# Patient Record
Sex: Female | Born: 2000 | Race: White | Hispanic: No | Marital: Single
Health system: Southern US, Community
[De-identification: ages and names within clinical notes are randomized; demographics above are authoritative.]

## PROBLEM LIST (undated history)

## (undated) HISTORY — PX: TONSILLECTOMY: SUR1361

## (undated) HISTORY — PX: SPINAL FUSION: SHX223

---

## 2022-02-04 ENCOUNTER — Emergency Department (HOSPITAL_COMMUNITY): Payer: Managed Care, Other (non HMO)

## 2022-02-04 ENCOUNTER — Emergency Department (HOSPITAL_COMMUNITY)
Admission: EM | Admit: 2022-02-04 | Discharge: 2022-02-04 | Disposition: A | Discharge status: Home / Self Care | Payer: Managed Care, Other (non HMO) | Attending: Emergency Medicine | Admitting: Emergency Medicine

## 2022-02-04 ENCOUNTER — Telehealth (HOSPITAL_COMMUNITY): Payer: Self-pay | Admitting: Emergency Medicine

## 2022-02-04 ENCOUNTER — Other Ambulatory Visit: Payer: Self-pay

## 2022-02-04 ENCOUNTER — Other Ambulatory Visit (HOSPITAL_COMMUNITY): Payer: Self-pay

## 2022-02-04 DIAGNOSIS — R1030 Lower abdominal pain, unspecified: Secondary | ICD-10-CM | POA: Diagnosis present

## 2022-02-04 DIAGNOSIS — N200 Calculus of kidney: Secondary | ICD-10-CM

## 2022-02-04 DIAGNOSIS — N390 Urinary tract infection, site not specified: Secondary | ICD-10-CM | POA: Insufficient documentation

## 2022-02-04 LAB — BASIC METABOLIC PANEL
Anion gap: 11 (ref 5–15)
BUN: 10 mg/dL (ref 6–20)
CO2: 21 mmol/L — ABNORMAL LOW (ref 22–32)
Calcium: 9.6 mg/dL (ref 8.9–10.3)
Chloride: 104 mmol/L (ref 98–111)
Creatinine, Ser: 0.98 mg/dL (ref 0.44–1.00)
GFR, Estimated: >60 mL/min (ref >60)
Glucose, Bld: 100 mg/dL — ABNORMAL HIGH (ref 70–99)
Potassium: 3.6 mmol/L (ref 3.5–5.1)
Sodium: 136 mmol/L (ref 135–145)

## 2022-02-04 LAB — CBC WITH DIFFERENTIAL/PLATELET
Abs Immature Granulocytes: 0.02 10*3/uL (ref 0.00–0.07)
Basophils Absolute: 0 10*3/uL (ref 0.0–0.1)
Basophils Relative: 0 %
Eosinophils Absolute: 0.2 10*3/uL (ref 0.0–0.5)
Eosinophils Relative: 3 %
HCT: 38.7 % (ref 36.0–46.0)
Hemoglobin: 13.2 g/dL (ref 12.0–15.0)
Immature Granulocytes: 0 %
Lymphocytes Relative: 46 %
Lymphs Abs: 4 10*3/uL (ref 0.7–4.0)
MCH: 28.9 pg (ref 26.0–34.0)
MCHC: 34.1 g/dL (ref 30.0–36.0)
MCV: 84.7 fL (ref 80.0–100.0)
Monocytes Absolute: 0.7 10*3/uL (ref 0.1–1.0)
Monocytes Relative: 9 %
Neutro Abs: 3.6 10*3/uL (ref 1.7–7.7)
Neutrophils Relative %: 42 %
Platelets: 332 10*3/uL (ref 150–400)
RBC: 4.57 MIL/uL (ref 3.87–5.11)
RDW: 12.2 % (ref 11.5–15.5)
WBC: 8.5 10*3/uL (ref 4.0–10.5)
nRBC: 0 % (ref 0.0–0.2)

## 2022-02-04 LAB — URINALYSIS, MICROSCOPIC (REFLEX)

## 2022-02-04 LAB — URINALYSIS, ROUTINE W REFLEX MICROSCOPIC
Bilirubin Urine: NEGATIVE
Glucose, UA: NEGATIVE mg/dL
Hgb urine dipstick: NEGATIVE
Ketones, ur: NEGATIVE mg/dL
Leukocytes,Ua: NEGATIVE
Nitrite: NEGATIVE
Protein, ur: 30 mg/dL — AB
Specific Gravity, Urine: 1.023 (ref 1.005–1.030)
pH: 5 (ref 5.0–8.0)

## 2022-02-04 LAB — I-STAT BETA HCG BLOOD, ED (MC, WL, AP ONLY): I-stat hCG, quantitative: <5 m[IU]/mL (ref <5)

## 2022-02-04 MED ORDER — OXYCODONE-ACETAMINOPHEN 5-325 MG PO TABS: 1.0000 | ORAL_TABLET | Freq: Four times a day (QID) | ORAL | 0 refills | Status: DC | PRN

## 2022-02-04 MED ORDER — OXYCODONE-ACETAMINOPHEN 5-325 MG PO TABS
1.0000 | ORAL_TABLET | Freq: Four times a day (QID) | ORAL | 0 refills | Status: DC | PRN
  Filled 2022-02-04: qty 20, 5d supply, fill #0

## 2022-02-04 MED ORDER — ONDANSETRON HCL 4 MG/2ML IJ SOLN
4.0000 mg | Freq: Once | INTRAMUSCULAR | Status: AC
  Administered 2022-02-04: 4 mg via INTRAVENOUS
  Filled 2022-02-04: qty 2

## 2022-02-04 MED ORDER — HYDROMORPHONE HCL 1 MG/ML IJ SOLN
1.0000 mg | Freq: Once | INTRAMUSCULAR | Status: AC
  Administered 2022-02-04: 1 mg via INTRAVENOUS
  Filled 2022-02-04: qty 1

## 2022-02-04 MED ORDER — KETOROLAC TROMETHAMINE 30 MG/ML IJ SOLN
30.0000 mg | Freq: Once | INTRAMUSCULAR | Status: AC
  Administered 2022-02-04: 30 mg via INTRAVENOUS
  Filled 2022-02-04: qty 1

## 2022-02-04 NOTE — Discharge Instructions (Addendum)
Begin taking Percocet as prescribed as needed for pain. ? ?Continue taking Macrobid as previously prescribed. ? ?Stop taking Pyridium. ? ?Follow-up with urology if the stone has not passed in the next 3 to 4 days.  The contact information for [facility] has been provided in this discharge summary for you to call and make these arrangements. ?

## 2022-02-04 NOTE — ED Triage Notes (Signed)
Pt reported to ED with c/o lower abdominal pain and pain to urethra. Pt states she was seen at [facility] today and diagnosed with UTI and told that if medication does not work within two hours to go to ED to be evaluated. ?

## 2022-02-04 NOTE — ED Provider Triage Note (Signed)
Emergency Medicine Provider Triage Evaluation Note ? ?Kimberly Bond , a 21 y.o. female  was evaluated in triage.  Pt complains of urinary frequency/dysuria.  Seen at UC earlier today and diagnosed with UTI.  She was given pyridium and macrobid but denies improvement.  States pain is severe.  Has had blood in urine.  Hx of stones.  No period in months, she is on Depo but did not have pregnancy test done earlier. ? ?Review of Systems  ?Positive: dysuria ?Negative: fever ? ?Physical Exam  ?BP (!) 138/99 (BP Location: Left Arm)   Pulse (!) 102   Temp 97.9 ?F (36.6 ?C)   Resp 18   SpO2 99%  ? ?Gen:   Awake, yelling/crying in triage ?Resp:  Normal effort  ?MSK:   Moves extremities without difficulty  ?Other:   ? ?Medical Decision Making  ?Medically screening exam initiated at 1:43 AM.  Appropriate orders placed.  Kimberly Bond was informed that the remainder of the evaluation will be completed by another provider, this initial triage assessment does not replace that evaluation, and the importance of remaining in the ED until their evaluation is complete. ? ?Dysuria.  Seen earlier for same, states meds are not helping.  She has not had a period in several months, on Depo.  No pregnancy test done earlier.  Will repeat labs, obtain cell study. ?  ?Kimberly Hatchet, PA-C ?02/04/22 0147 ? ?

## 2022-02-04 NOTE — Telephone Encounter (Signed)
Pharmacy was reportedly out of medication.  We will send to other pharmacy. ?

## 2022-02-04 NOTE — ED Provider Notes (Signed)
?[facility] EMERGENCY DEPARTMENT ?Provider Note ? ? ?CSN: [id number] ?Arrival date & time: 02/04/22  0128 ? ?  ? ?History ? ?Chief Complaint  ?Patient presents with  ? urinary pain  ? ? ?Kimberly Bond is a 21 y.o. female. ? ?Patient is a 21 year old female with history of kidney stones presenting with complaints of dysuria, suprapubic pain.  This began this morning and worsened throughout the day.  She went to urgent care and was diagnosed with a UTI.  She was treated with antibiotics and Pyridium.  She presents here stating that her symptoms are no better and are worsening.  She denies any fevers or chills.  She denies any bowel complaints. ? ?The history is provided by the patient.  ? ?  ? ?Home Medications ?Prior to Admission medications   ?Not on File  ?   ? ?Allergies    ?Azithromycin, Meloxicam, and Morphine   ? ?Review of Systems   ?Review of Systems  ?All other systems reviewed and are negative. ? ?Physical Exam ?Updated Vital Signs ?BP (!) 138/99 (BP Location: Left Arm)   Pulse (!) 102   Temp 97.9 ?F (36.6 ?C)   Resp 18   SpO2 99%  ?Physical Exam ?Vitals and nursing note reviewed.  ?Constitutional:   ?   General: She is not in acute distress. ?   Appearance: She is well-developed. She is not diaphoretic.  ?HENT:  ?   Head: Normocephalic and atraumatic.  ?Cardiovascular:  ?   Rate and Rhythm: Normal rate and regular rhythm.  ?   Heart sounds: No murmur heard. ?  No friction rub. No gallop.  ?Pulmonary:  ?   Effort: Pulmonary effort is normal. No respiratory distress.  ?   Breath sounds: Normal breath sounds. No wheezing.  ?Abdominal:  ?   General: Bowel sounds are normal. There is no distension.  ?   Palpations: Abdomen is soft.  ?   Tenderness: There is abdominal tenderness. There is no right CVA tenderness, left CVA tenderness, guarding or rebound.  ?   Comments: There is suprapubic tenderness, but no rebound or guarding.  ?Musculoskeletal:     ?   General: Normal range of motion.  ?    Cervical back: Normal range of motion and neck supple.  ?Skin: ?   General: Skin is warm and dry.  ?Neurological:  ?   General: No focal deficit present.  ?   Mental Status: She is alert and oriented to person, place, and time.  ? ? ?ED Results / Procedures / Treatments   ?Labs ?(all labs ordered are listed, but only abnormal results are displayed) ?Labs Reviewed  ?BASIC METABOLIC PANEL - Abnormal; Notable for the following components:  ?    Result Value  ? CO2 21 (*)   ? Glucose, Bld 100 (*)   ? All other components within normal limits  ?URINALYSIS, ROUTINE W REFLEX MICROSCOPIC - Abnormal; Notable for the following components:  ? Color, Urine ORANGE (*)   ? APPearance HAZY (*)   ? Protein, ur 30 (*)   ? All other components within normal limits  ?URINALYSIS, MICROSCOPIC (REFLEX) - Abnormal; Notable for the following components:  ? Bacteria, UA RARE (*)   ? All other components within normal limits  ?URINE CULTURE  ?CBC WITH DIFFERENTIAL/PLATELET  ?I-STAT BETA HCG BLOOD, ED ([facility], [facility], [facility] ONLY)  ? ? ?EKG ?None ? ?Radiology ?CT Renal Stone Study ? ?Result Date: 02/04/2022 ?CLINICAL DATA:  Flank pain, kidney stone suspected. EXAM: CT ABDOMEN AND PELVIS WITHOUT CONTRAST TECHNIQUE: Multidetector CT imaging of the abdomen and pelvis was performed following the standard protocol without IV contrast. RADIATION DOSE REDUCTION: This exam was performed according to the departmental dose-optimization program which includes automated exposure control, adjustment of the mA and/or kV according to patient size and/or use of iterative reconstruction technique. COMPARISON:  None. FINDINGS: Lower chest: No acute abnormality. Hepatobiliary: There is focal fatty infiltration in the left lobe of the liver adjacent to the gallbladder fossa. The gallbladder is without stones. No biliary ductal dilatation. Pancreas: Unremarkable. No pancreatic ductal dilatation or surrounding inflammatory changes. Spleen: Normal in size without focal  abnormality. Adrenals/Urinary Tract: The adrenal glands are within normal limits. Punctate calcifications are noted in the kidneys bilaterally. There is mild obstructive uropathy on the right with a 2 mm calculus in the region of the distal right ureter. No ureteral calculus or obstructive uropathy on the left. A 2 mm calculus is present in the urinary bladder on the right posteriorly, best seen on coronal image 41. Stomach/Bowel: Stomach is within normal limits. Appendix appears normal. No evidence of bowel wall thickening, distention, or inflammatory changes. Vascular/Lymphatic: The aorta is normal in caliber. Prominent nonspecific lymph nodes are noted in the mesentery in the right lower quadrant measuring up to 1 cm in short axis diameter. Reproductive: Uterus and bilateral adnexa are unremarkable. Other: No abdominal wall hernia or abnormality. No abdominopelvic ascites. Musculoskeletal: Spinal fusion hardware is noted in the lower thoracic spine. No acute osseous abnormality. IMPRESSION: 1. Mild obstructive uropathy on the right with a 2 mm calculus in the distal right ureter. A 2 mm calculus is noted in the posterior aspect of the urinary bladder, suggesting recently passed stone. 2. Bilateral nephrolithiasis. Electronically Signed   By: Thornell Sartorius M.D.   On: 02/04/2022 02:58   ? ?Procedures ?Procedures  ? ? ?Medications Ordered in ED ?Medications  ?HYDROmorphone (DILAUDID) injection 1 mg (has no administration in time range)  ?ketorolac (TORADOL) 30 MG/ML injection 30 mg (has no administration in time range)  ?ondansetron (ZOFRAN) injection 4 mg (has no administration in time range)  ? ? ?ED Course/ Medical Decision Making/ A&P ? ?Patient presenting here with complaints of burning with urination, flank pain, and hematuria.  She was seen in urgent care and diagnosed with a UTI and told if she was not improving she should come to the ER to be evaluated.  Patient arrives here with stable vital signs, but  does have some suprapubic tenderness.  A CT scan was ordered in triage revealing mild right-sided obstructive uropathy with a 2 mm calculus in the distal right ureter.  There is also a 2 mm calculus noted in the posterior aspect of the urinary bladder suggesting a recently passed stone. ? ?Patient given Toradol and Dilaudid and is feeling significantly improved.  At this point, I feel as though she can safely be discharged.  She is to take Percocet as prescribed as needed for pain.  I have advised her to continue the antibiotic, but discontinue the Pyridium. ? ?Final Clinical Impression(s) / ED Diagnoses ?Final diagnoses:  ?None  ? ? ?Rx / DC Orders ?ED Discharge Orders   ? ? None  ? ?  ? ? ?  ?Geoffery Lyons, MD ?02/04/22 [phone] ? ?

## 2022-02-05 LAB — URINE CULTURE: Culture: NO GROWTH

## 2022-02-08 ENCOUNTER — Other Ambulatory Visit (HOSPITAL_COMMUNITY): Payer: Self-pay

## 2022-06-23 ENCOUNTER — Ambulatory Visit (HOSPITAL_COMMUNITY)
Admission: RE | Admit: 2022-06-23 | Discharge: 2022-06-23 | Disposition: A | Discharge status: Home / Self Care | Payer: Managed Care, Other (non HMO) | Source: Ambulatory Visit | Attending: Nurse Practitioner | Admitting: Nurse Practitioner

## 2022-06-23 ENCOUNTER — Encounter (HOSPITAL_COMMUNITY): Payer: Self-pay

## 2022-06-23 VITALS — BP 130/88 | HR 118 | Temp 99.0°F | Resp 16

## 2022-06-23 DIAGNOSIS — N898 Other specified noninflammatory disorders of vagina: Secondary | ICD-10-CM

## 2022-06-23 MED ORDER — FLUCONAZOLE 150 MG PO TABS: 150.0000 mg | ORAL_TABLET | Freq: Every day | ORAL | 0 refills | Status: AC

## 2022-06-23 NOTE — ED Provider Notes (Signed)
[facility]    CSN: [id number] Arrival date & time: 06/23/22  1154      History   Chief Complaint Chief Complaint  Patient presents with   Vaginal Itching    I tried using Monistat 1 three days ago but it did not help my symptoms and it is getting worse. - Entered by patient   appt 12    HPI Kimberly Bond is a 21 y.o. female.   HPI  She has 4 days of itching and burning. She has used otc treatment without much success. She has had these symptoms have gotten worse. She is concern because she has been sexually active. Denies any pelvic pain or tenderness, amenorrhea irregular bleeding or prolonged heavy bleeding.  Denies vaginal discharge or dysuria.  Denies ulcers or lesions   History reviewed. No pertinent past medical history.  There are no problems to display for this patient.   Past Surgical History:  Procedure Laterality Date   SPINAL FUSION     TONSILLECTOMY      OB History   No obstetric history on file.      Home Medications    Prior to Admission medications   Medication Sig Start Date End Date Taking? Authorizing Provider  fluconazole (DIFLUCAN) 150 MG tablet Take 1 tablet (150 mg total) by mouth daily. 06/23/22  Yes Barbette Merino, NP  acetaminophen (TYLENOL) 500 MG tablet Take 1,000 mg by mouth every 6 (six) hours as needed for moderate pain or headache.    [provider]  ADZENYS XR-ODT 15.7 MG TBED Take 15.7 mg by mouth every morning. 01/29/22   [provider]  buPROPion (WELLBUTRIN XL) 300 MG 24 hr tablet Take 300 mg by mouth daily. 02/01/22   [provider]  hydrOXYzine (ATARAX) 50 MG tablet Take 50 mg by mouth at bedtime as needed for anxiety. 12/10/21   [provider]  lamoTRIgine (LAMICTAL) 200 MG tablet Take 400 mg by mouth daily. 02/01/22   [provider]  meclizine (ANTIVERT) 25 MG tablet Take 25 mg by mouth every 4 (four) hours as needed for dizziness. 12/30/21   [provider]   medroxyPROGESTERone Acetate 150 MG/ML SUSY Inject 150 mg into the muscle every 3 (three) months. 12/10/21   [provider]  methocarbamol (ROBAXIN) 500 MG tablet Take 500 mg by mouth 3 (three) times daily. 05/17/22   [provider]  Multiple Vitamins-Minerals (ONE-A-DAY WOMENS PO) Take 1 tablet by mouth daily.    [provider]  nitrofurantoin, macrocrystal-monohydrate, (MACROBID) 100 MG capsule Take 100 mg by mouth See admin instructions. Bid x 7 days 02/03/22   [provider]  ondansetron (ZOFRAN) 8 MG tablet Take 8 mg by mouth every 8 (eight) hours as needed for nausea or vomiting. 01/29/22   [provider]  VYVANSE 50 MG capsule Take 50 mg by mouth every morning. 06/19/22   [provider]    Family History No family history on file.  Social History     Allergies   Azithromycin, Meloxicam, and Morphine   Review of Systems Review of Systems   Physical Exam Triage Vital Signs ED Triage Vitals  Enc Vitals Group     BP 06/23/22 1212 130/88     Pulse Rate 06/23/22 1212 (!) 118     Resp 06/23/22 1212 16     Temp 06/23/22 1212 99 F (37.2 C)     Temp Source 06/23/22 1212 Oral     SpO2  06/23/22 1212 99 %     Weight --      Height --      Head Circumference --      Peak Flow --      Pain Score 06/23/22 1210 0     Pain Loc --      Pain Edu? --      Excl. in GC? --    No data found.  Updated Vital Signs BP 130/88 (BP Location: Right Arm)   Pulse (!) 118   Temp 99 F (37.2 C) (Oral)   Resp 16   SpO2 99%   Visual Acuity Right Eye Distance:   Left Eye Distance:   Bilateral Distance:    Right Eye Near:   Left Eye Near:    Bilateral Near:     Physical Exam Constitutional:      Appearance: She is obese.  HENT:     Head: Normocephalic and atraumatic.  Cardiovascular:     Rate and Rhythm: Normal rate.     Pulses: Normal pulses.  Pulmonary:     Effort: Pulmonary effort is normal.  Musculoskeletal:      Cervical back: Normal range of motion.  Skin:    General: Skin is warm.     Capillary Refill: Capillary refill takes less than 2 seconds.  Neurological:     General: No focal deficit present.     Mental Status: She is alert.  Psychiatric:        Mood and Affect: Mood normal.        Behavior: Behavior normal.        Thought Content: Thought content normal.        Judgment: Judgment normal.      UC Treatments / Results  Labs (all labs ordered are listed, but only abnormal results are displayed) Labs Reviewed  CERVICOVAGINAL ANCILLARY ONLY - Abnormal; Notable for the following components:      Result Value   Candida Vaginitis Positive (*)    All other components within normal limits    EKG   Radiology No results found.  Procedures Procedures (including critical care time)  Medications Ordered in UC Medications - No data to display  Initial Impression / Assessment and Plan / UC Course  I have reviewed the triage vital signs and the nursing notes.  Pertinent labs & imaging results that were available during my care of the patient were reviewed by me and considered in my medical decision making (see chart for details).    Vaginal itch Will start with Diflucan 150 mg x 1  Vaginal swab pending Final Clinical Impressions(s) / UC Diagnoses   Final diagnoses:  Vaginal itching     Discharge Instructions      Vaginal swab pending  Empiric treatment with Diflucan 150 mg daily You will be called if any additional treatment is needed based on results.  If symptoms persist or get worse please return      ED Prescriptions     Medication Sig Dispense Auth. Provider   fluconazole (DIFLUCAN) 150 MG tablet Take 1 tablet (150 mg total) by mouth daily. 1 tablet Barbette Merino, NP      PDMP not reviewed this encounter.   Thad Ranger [place], [place] 06/27/22 [phone]

## 2022-06-23 NOTE — ED Triage Notes (Signed)
Pt c/o vaginal itching x 4 days. Tried Monastat 1 OTC without relief.

## 2022-06-23 NOTE — Discharge Instructions (Addendum)
Vaginal swab pending  Empiric treatment with Diflucan 150 mg daily You will be called if any additional treatment is needed based on results.  If symptoms persist or get worse please return

## 2022-06-24 LAB — CERVICOVAGINAL ANCILLARY ONLY
Bacterial Vaginitis (gardnerella): NEGATIVE
Candida Glabrata: NEGATIVE
Candida Vaginitis: POSITIVE — AB
Chlamydia: NEGATIVE
Comment: NEGATIVE
Comment: NEGATIVE
Comment: NEGATIVE
Comment: NEGATIVE
Comment: NEGATIVE
Comment: NORMAL
Neisseria Gonorrhea: NEGATIVE
Trichomonas: NEGATIVE

## 2022-11-23 ENCOUNTER — Other Ambulatory Visit: Payer: Self-pay | Admitting: Medical

## 2022-11-23 DIAGNOSIS — M4120 Other idiopathic scoliosis, site unspecified: Secondary | ICD-10-CM

## 2022-12-20 ENCOUNTER — Ambulatory Visit
Admission: RE | Admit: 2022-12-20 | Discharge: 2022-12-20 | Disposition: A | Discharge status: Home / Self Care | Payer: Managed Care, Other (non HMO) | Source: Ambulatory Visit | Attending: Neurosurgery | Admitting: Neurosurgery

## 2022-12-20 DIAGNOSIS — M4120 Other idiopathic scoliosis, site unspecified: Secondary | ICD-10-CM

## 2023-05-15 ENCOUNTER — Other Ambulatory Visit: Payer: Self-pay | Admitting: Family Medicine

## 2023-05-15 DIAGNOSIS — R1030 Lower abdominal pain, unspecified: Secondary | ICD-10-CM

## 2023-08-20 IMAGING — CT CT RENAL STONE PROTOCOL
2 of 4 series · 16 of 46 positions shown, 18 images · non-contrast
Comparison: None.

CLINICAL DATA: Flank pain, kidney stone suspected.



[Series 3: stone study 5.0 i30f 2 · axial · 0.93mm/px · z∈[+699,+1119]mm · 13 of 92 slices shown, 15 images]
[im 4/92  soft-tissue]
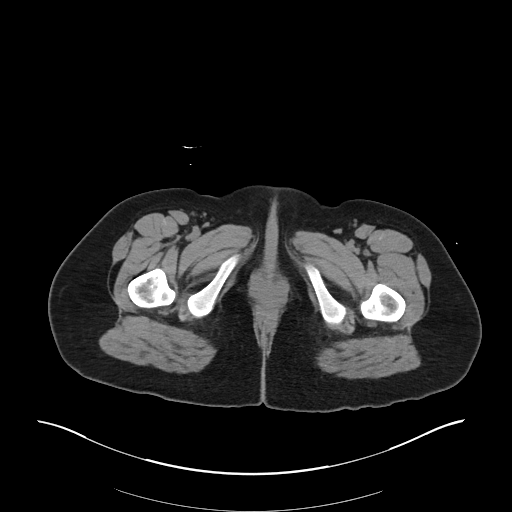
[im 4/92  bone]
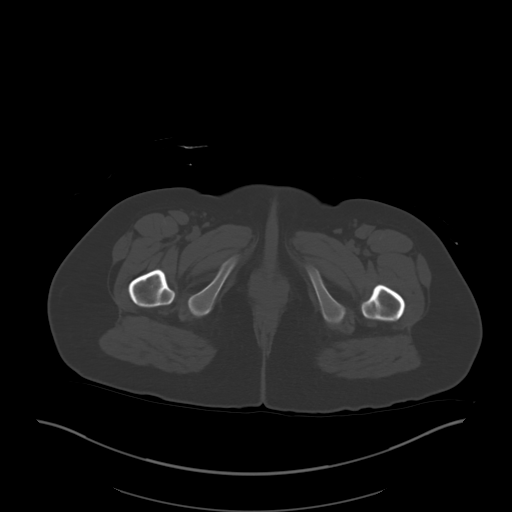
[im 11/92  soft-tissue]
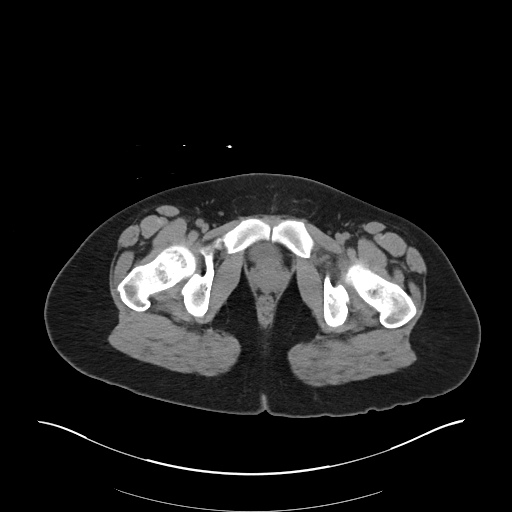
[im 19/92  soft-tissue]
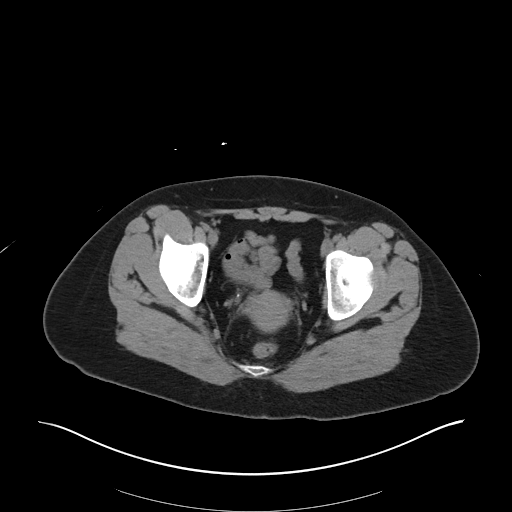
[im 26/92  soft-tissue]
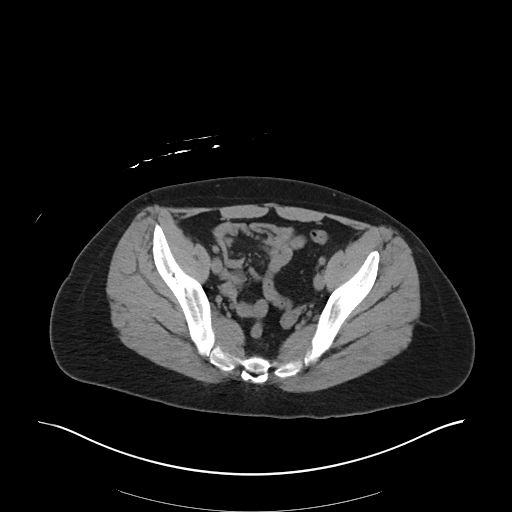
[im 33/92  soft-tissue]
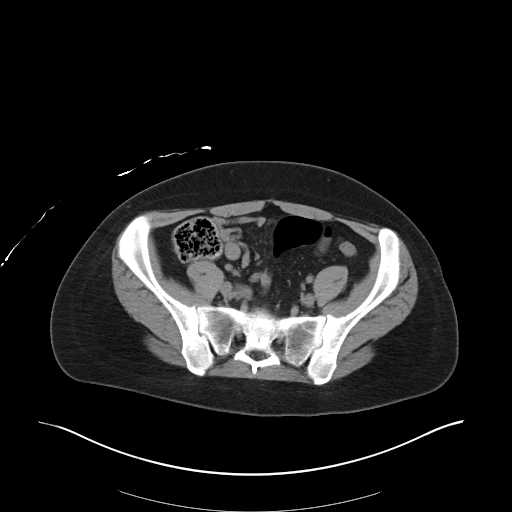
[im 41/92  soft-tissue]
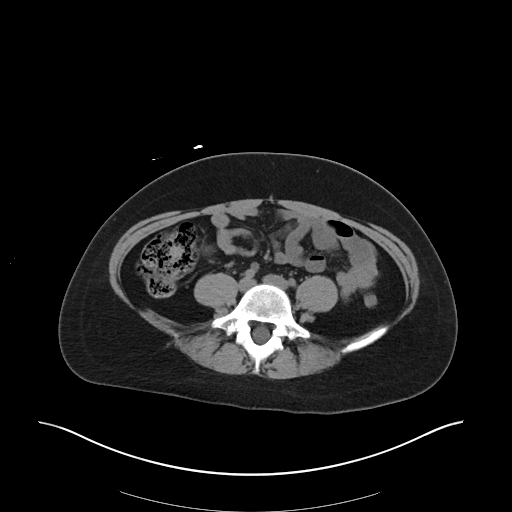
[im 48/92  soft-tissue]
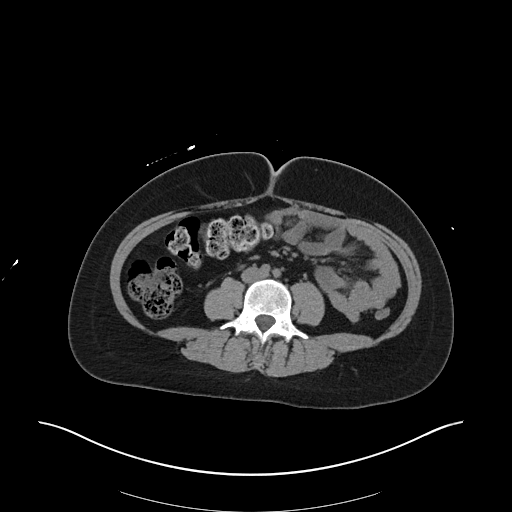
[im 51/92  soft-tissue]
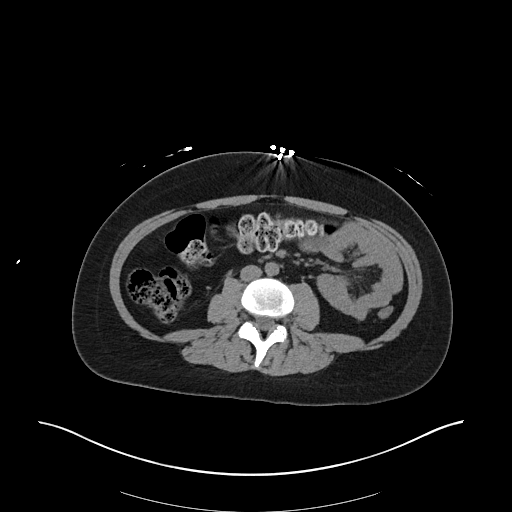
[im 59/92  soft-tissue]
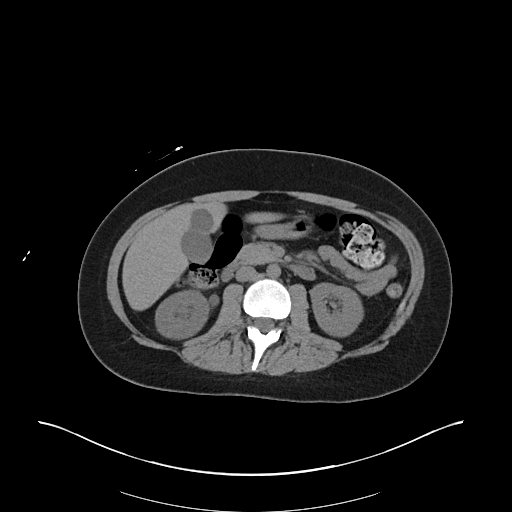
[im 59/92  bone]
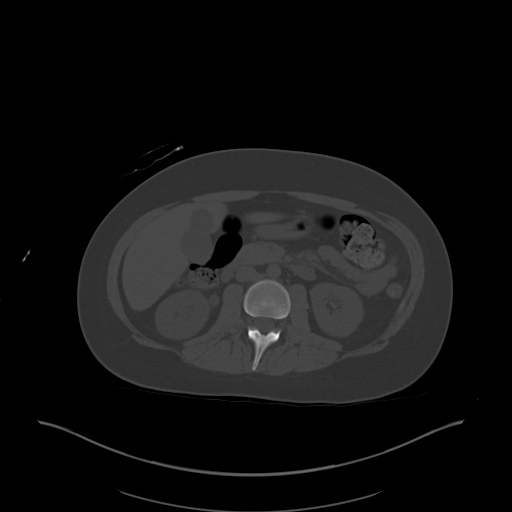
[im 66/92  soft-tissue]
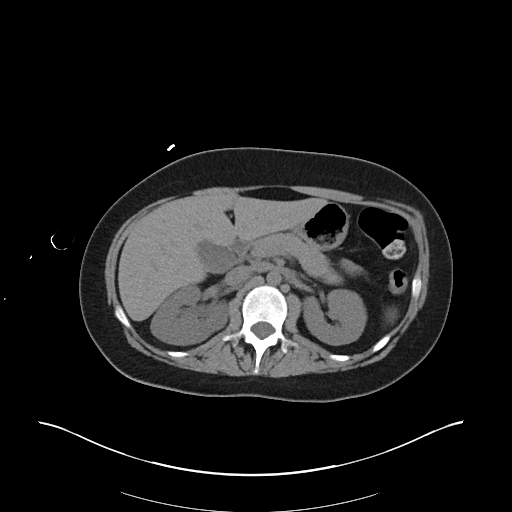
[im 73/92  soft-tissue]
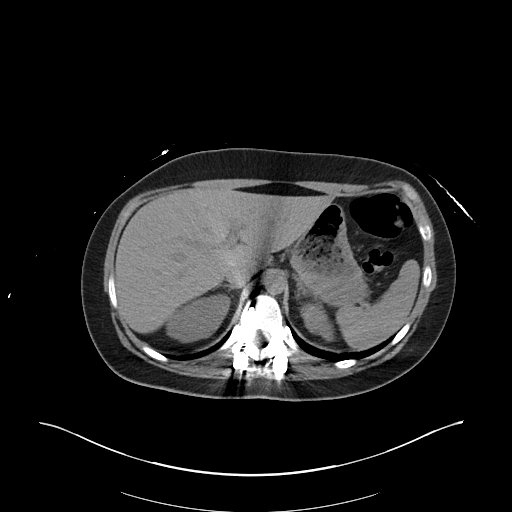
[im 81/92  soft-tissue]
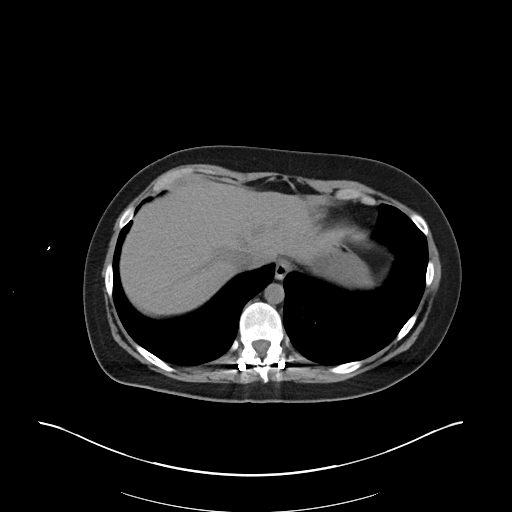
[im 88/92  soft-tissue]
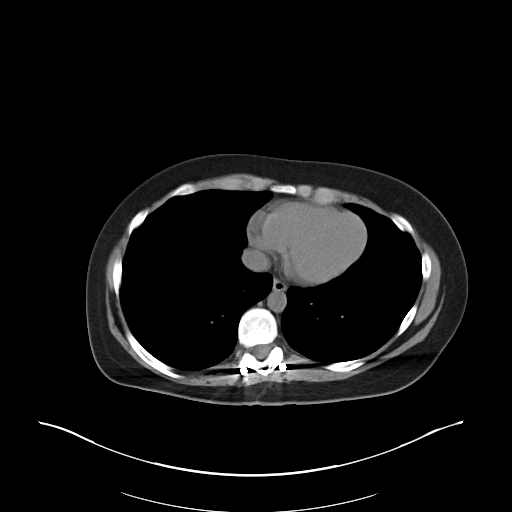

[Series 6: coronal soft tissue · coronal · 0.82mm/px · 3 of 85 slices shown]
[im 29/85  soft-tissue]
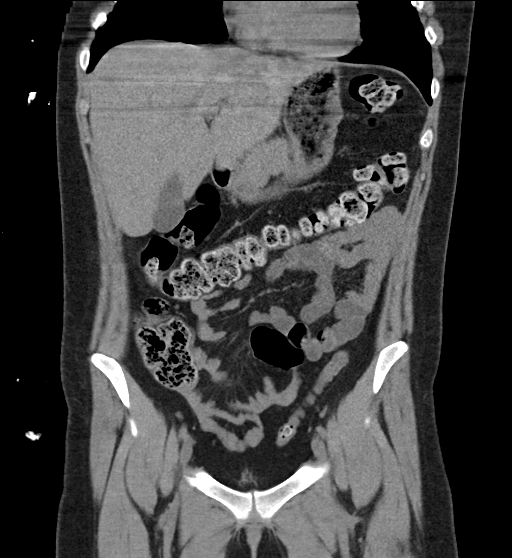
[im 38/85  soft-tissue]
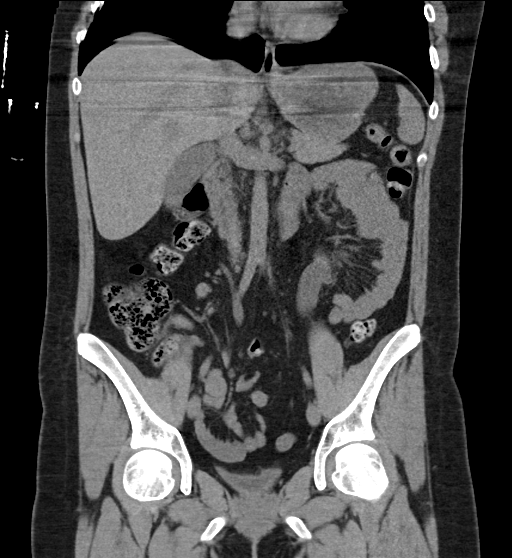
[im 47/85  soft-tissue]
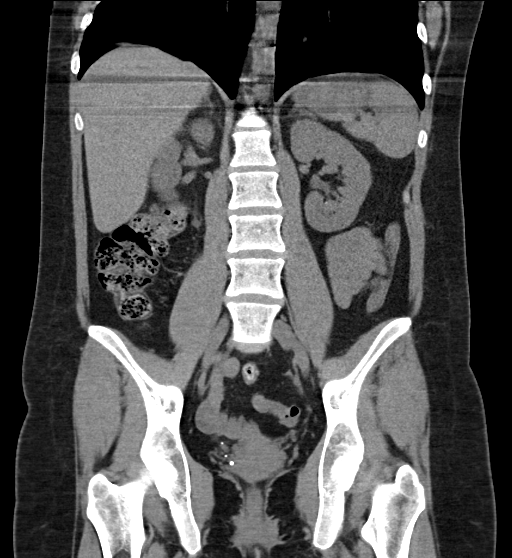

[16 of 46 positions shown; findings below may reference images not displayed]

FINDINGS: Lower chest: No acute abnormality.

Hepatobiliary: There is focal fatty infiltration in the left lobe of
the liver adjacent to the gallbladder fossa. The gallbladder is
without stones. No biliary ductal dilatation.

Pancreas: Unremarkable. No pancreatic ductal dilatation or
surrounding inflammatory changes.

Spleen: Normal in size without focal abnormality.

Adrenals/Urinary Tract: The adrenal glands are within normal limits.
Punctate calcifications are noted in the kidneys bilaterally. There
is mild obstructive uropathy on the right with a 2 mm calculus in
the region of the distal right ureter. No ureteral calculus or
obstructive uropathy on the left. A 2 mm calculus is present in the
urinary bladder on the right posteriorly, best seen on coronal image
41.

Stomach/Bowel: Stomach is within normal limits. Appendix appears
normal. No evidence of bowel wall thickening, distention, or
inflammatory changes.

Vascular/Lymphatic: The aorta is normal in caliber. Prominent
nonspecific lymph nodes are noted in the mesentery in the right
lower quadrant measuring up to 1 cm in short axis diameter.

Reproductive: Uterus and bilateral adnexa are unremarkable.

Other: No abdominal wall hernia or abnormality. No abdominopelvic
ascites.

Musculoskeletal: Spinal fusion hardware is noted in the lower
thoracic spine. No acute osseous abnormality.
IMPRESSION: 1. Mild obstructive uropathy on the right with a 2 mm calculus in
the distal right ureter. A 2 mm calculus is noted in the posterior
aspect of the urinary bladder, suggesting recently passed stone.
2. Bilateral nephrolithiasis.

## 2024-11-25 ENCOUNTER — Emergency Department (HOSPITAL_COMMUNITY)

## 2024-11-25 ENCOUNTER — Encounter (HOSPITAL_COMMUNITY): Payer: Self-pay

## 2024-11-25 ENCOUNTER — Other Ambulatory Visit: Payer: Self-pay

## 2024-11-25 ENCOUNTER — Emergency Department (HOSPITAL_COMMUNITY)
Admission: EM | Admit: 2024-11-25 | Discharge: 2024-11-25 | Disposition: A | Discharge status: Home / Self Care | Attending: Emergency Medicine | Admitting: Emergency Medicine

## 2024-11-25 DIAGNOSIS — N201 Calculus of ureter: Secondary | ICD-10-CM

## 2024-11-25 DIAGNOSIS — R10A2 Flank pain, left side: Secondary | ICD-10-CM

## 2024-11-25 DIAGNOSIS — R1032 Left lower quadrant pain: Secondary | ICD-10-CM | POA: Insufficient documentation

## 2024-11-25 LAB — URINALYSIS, ROUTINE W REFLEX MICROSCOPIC
Bacteria, UA: NONE SEEN
Glucose, UA: NEGATIVE mg/dL
Ketones, ur: 5 mg/dL — AB
Leukocytes,Ua: NEGATIVE
Nitrite: NEGATIVE
Protein, ur: NEGATIVE mg/dL
Specific Gravity, Urine: 1.025 (ref 1.005–1.030)
pH: 6 (ref 5.0–8.0)

## 2024-11-25 LAB — COMPREHENSIVE METABOLIC PANEL WITH GFR
ALT: 42 U/L (ref 0–44)
AST: 33 U/L (ref 15–41)
Albumin: 5 g/dL (ref 3.5–5.0)
Alkaline Phosphatase: 61 U/L (ref 38–126)
Anion gap: 15 (ref 5–15)
BUN: 12 mg/dL (ref 6–20)
CO2: 22 mmol/L (ref 22–32)
Calcium: 10.3 mg/dL (ref 8.9–10.3)
Chloride: 102 mmol/L (ref 98–111)
Creatinine, Ser: 1.02 mg/dL — ABNORMAL HIGH (ref 0.44–1.00)
GFR, Estimated: >60 mL/min
Glucose, Bld: 102 mg/dL — ABNORMAL HIGH (ref 70–99)
Potassium: 3.9 mmol/L (ref 3.5–5.1)
Sodium: 139 mmol/L (ref 135–145)
Total Bilirubin: 0.5 mg/dL (ref 0.0–1.2)
Total Protein: 8.1 g/dL (ref 6.5–8.1)

## 2024-11-25 LAB — CBC
HCT: 40.9 % (ref 36.0–46.0)
Hemoglobin: 13.5 g/dL (ref 12.0–15.0)
MCH: 28.9 pg (ref 26.0–34.0)
MCHC: 33 g/dL (ref 30.0–36.0)
MCV: 87.6 fL (ref 80.0–100.0)
Platelets: 436 K/uL — ABNORMAL HIGH (ref 150–400)
RBC: 4.67 MIL/uL (ref 3.87–5.11)
RDW: 12.3 % (ref 11.5–15.5)
WBC: 10.7 K/uL — ABNORMAL HIGH (ref 4.0–10.5)
nRBC: 0 % (ref 0.0–0.2)

## 2024-11-25 LAB — HCG, SERUM, QUALITATIVE: Preg, Serum: NEGATIVE

## 2024-11-25 MED ORDER — OXYCODONE-ACETAMINOPHEN 5-325 MG PO TABS: 1.0000 | ORAL_TABLET | Freq: Four times a day (QID) | ORAL | 0 refills | Status: AC | PRN

## 2024-11-25 MED ORDER — SODIUM CHLORIDE 0.9 % IV BOLUS
1000.0000 mL | Freq: Once | INTRAVENOUS | Status: AC
  Administered 2024-11-25: 1000 mL via INTRAVENOUS

## 2024-11-25 MED ORDER — ONDANSETRON HCL 4 MG/2ML IJ SOLN
4.0000 mg | Freq: Once | INTRAMUSCULAR | Status: AC
  Administered 2024-11-25: 4 mg via INTRAVENOUS
  Filled 2024-11-25: qty 2

## 2024-11-25 MED ORDER — TAMSULOSIN HCL 0.4 MG PO CAPS: 0.4000 mg | ORAL_CAPSULE | Freq: Every day | ORAL | 0 refills | Status: AC

## 2024-11-25 MED ORDER — HYDROMORPHONE HCL 1 MG/ML IJ SOLN
0.5000 mg | Freq: Once | INTRAMUSCULAR | Status: AC
  Administered 2024-11-25: 0.5 mg via INTRAVENOUS
  Filled 2024-11-25: qty 1

## 2024-11-25 MED ORDER — KETOROLAC TROMETHAMINE 15 MG/ML IJ SOLN
15.0000 mg | Freq: Once | INTRAMUSCULAR | Status: AC
  Administered 2024-11-25: 15 mg via INTRAVENOUS
  Filled 2024-11-25: qty 1

## 2024-11-25 MED ORDER — IBUPROFEN 600 MG PO TABS: 600.0000 mg | ORAL_TABLET | Freq: Four times a day (QID) | ORAL | 0 refills | Status: AC | PRN

## 2024-11-25 MED ORDER — HYDROMORPHONE HCL 1 MG/ML IJ SOLN
1.0000 mg | Freq: Once | INTRAMUSCULAR | Status: AC
  Administered 2024-11-25: 1 mg via INTRAVENOUS
  Filled 2024-11-25: qty 1

## 2024-11-25 MED ORDER — MAGNESIUM SULFATE 2 GM/50ML IV SOLN
2.0000 g | Freq: Once | INTRAVENOUS | Status: AC
  Administered 2024-11-25: 2 g via INTRAVENOUS
  Filled 2024-11-25: qty 50

## 2024-11-25 MED ORDER — OXYCODONE-ACETAMINOPHEN 5-325 MG PO TABS
2.0000 | ORAL_TABLET | Freq: Once | ORAL | Status: AC
  Administered 2024-11-25: 2 via ORAL
  Filled 2024-11-25: qty 2

## 2024-11-25 NOTE — ED Provider Notes (Signed)
" °  Physical Exam  BP 126/82   Pulse 87   Temp 98 F (36.7 C) (Oral)   Resp 20   SpO2 98%   Physical Exam  Procedures  Procedures  ED Course / MDM    Medical Decision Making Amount and/or Complexity of Data Reviewed Labs: ordered. Radiology: ordered.  Risk Prescription drug management.   Care signed off by R. Sponseller, PA-C   Kimberly Bond is a 24 y.o. female who had poor prep for left-sided flank pain that radiated to the left lower abdomen, previous history of nephrolithiasis with passage of stone approximately 2 weeks ago.  Had increased nausea but no vomiting.  Prior to care assumption extensive workup undertaken including CMP and CBC, of which the CMP did show a creatinine of 1.02 which is baseline increased compared to previous labs.  She been given 2 doses of hydromorphone  which she stated helped reduce her pain but has not alleviated her pain.  Pending results of renal stone study and UA at time of care handoff.  On reassessment the patient states that she has continued pain, provided her with 15 mg IV Toradol  and will reassess.  Also patient states that she would like to be able to drink fluids, will begin p.o. challenge and reassess.  CT renal stone study demonstrated mild left hydroureteronephrosis with punctate 1-2 mm distal left ureteral stone. No other urinary stone disease evident.  UA was obtained which shows some mild hemoglobinuria otherwise no signs of infection.  After ketorolac  patient had some improvement but still had pronounced right flank pain.  Trial course of magnesium  sulfate and after reassessment patient is substantially improved.  She states that pain is nearly relieved, and subjectively she states she is much improved from how she was at her previous reassessment.  Given this clinical improvement, and the fact that her vital signs are stable within normal limits, the plan at this time is to discharge with outpatient follow-up to urology.  Have  provided her with a prescription for tamsulosin  given the stones were found in the ureter, also will continue to provide NSAIDs for pain management and as needed oxycodone  for management of severe pain at breakthrough NSAIDs.  She understands agrees has no further concerns at this time and thus will be discharged to outpatient follow-up.       Kimberly Bond, [place] 11/25/24 1246    Bond, Kimberly PARAS, MD 11/25/24 1450  "

## 2024-11-25 NOTE — ED Triage Notes (Addendum)
 Pt. Arrives via ems for left flank pain x1 hr. Pt. Recently passed a kidney stone now having new flank pain and bladder pain when she urines. Reports nausea. No vomiting but dry heaving.

## 2024-11-25 NOTE — Discharge Instructions (Addendum)
 As discussed, is important you follow-up with your urologist for continued evaluation of your flank pain, and the kidney stones that were found today.  Medications been provided to you for pain relief in the outpatient setting, encourage you to use the ibuprofen  for pain and only use the provided oxycodone  if pain is above and beyond what is handled by the ibuprofen .  Otherwise continue the tamsulosin , and again follow-up with urology.  He can come back here to the emergency department should you have any further concerns that are not able to be addressed at your urologist.

## 2024-11-25 NOTE — ED Provider Notes (Signed)
 " [place] EMERGENC[facility] DEPARTMENT AT [facility] Provider Note   CSN: [id number] Arrival date & time: 11/25/24  0530     Patient presents with: Flank Pain   Kimberly Bond is a 24 y.o. female with history of nephrolithiasis, states she passed a kidney stone 2 weeks ago.  Presents with 1-1/2 hours of left-sided flank pain that radiates down to her left lower abdomen, states that it feels like someone is sitting on her bladder, screaming and crying out in pain.  Nausea and dry heaving but no vomiting.  No fevers or chills.   HPI     Prior to Admission medications  Medication Sig Start Date End Date Taking? Authorizing Provider  acetaminophen  (TYLENOL ) 500 MG tablet Take 1,000 mg by mouth every 6 (six) hours as needed for moderate pain or headache.    [provider]  ADZENYS XR-ODT 15.7 MG TBED Take 15.7 mg by mouth every morning. 01/29/22   [provider]  buPROPion (WELLBUTRIN XL) 300 MG 24 hr tablet Take 300 mg by mouth daily. 02/01/22   [provider]  fluconazole  (DIFLUCAN ) 150 MG tablet Take 1 tablet (150 mg total) by mouth daily. 06/23/22   Myrna Camelia HERO, NP  hydrOXYzine (ATARAX) 50 MG tablet Take 50 mg by mouth at bedtime as needed for anxiety. 12/10/21   [provider]  lamoTRIgine (LAMICTAL) 200 MG tablet Take 400 mg by mouth daily. 02/01/22   [provider]  meclizine (ANTIVERT) 25 MG tablet Take 25 mg by mouth every 4 (four) hours as needed for dizziness. 12/30/21   [provider]  medroxyPROGESTERone Acetate 150 MG/ML SUSY Inject 150 mg into the muscle every 3 (three) months. 12/10/21   [provider]  methocarbamol (ROBAXIN) 500 MG tablet Take 500 mg by mouth 3 (three) times daily. 05/17/22   [provider]  Multiple Vitamins-Minerals (ONE-A-DAY WOMENS PO) Take 1 tablet by mouth daily.    [provider]  nitrofurantoin, macrocrystal-monohydrate, (MACROBID) 100 MG capsule Take 100 mg by  mouth See admin instructions. Bid x 7 days 02/03/22   [provider]  ondansetron  (ZOFRAN ) 8 MG tablet Take 8 mg by mouth every 8 (eight) hours as needed for nausea or vomiting. 01/29/22   [provider]  VYVANSE 50 MG capsule Take 50 mg by mouth every morning. 06/19/22   [provider]    Allergies: Azithromycin, Meloxicam, and Morphine    Review of Systems  Constitutional:  Positive for appetite change.  HENT: Negative.    Respiratory: Negative.    Cardiovascular: Negative.   Gastrointestinal:  Positive for nausea. Negative for vomiting.  Genitourinary:  Positive for decreased urine volume, dysuria, flank pain, frequency and urgency.    Updated Vital Signs BP (!) 146/99   Pulse 77   Temp 98 F (36.7 C) (Oral)   Resp (!) 23 Comment: crying  SpO2 100%   Physical Exam Vitals and nursing note reviewed.  Constitutional:      General: She is in acute distress (Pain).     Appearance: She is not toxic-appearing.  HENT:     Head: Normocephalic and atraumatic.     Mouth/Throat:     Mouth: Mucous membranes are moist.     Pharynx: No oropharyngeal exudate or posterior oropharyngeal erythema.  Eyes:     General:        Right eye: No discharge.        Left eye: No discharge.  Conjunctiva/sclera: Conjunctivae normal.  Cardiovascular:     Rate and Rhythm: Normal rate and regular rhythm.     Pulses: Normal pulses.     Heart sounds: Normal heart sounds. No murmur heard. Pulmonary:     Effort: Pulmonary effort is normal. No respiratory distress.     Breath sounds: Normal breath sounds. No wheezing or rales.  Abdominal:     General: Bowel sounds are normal. There is no distension.     Palpations: Abdomen is soft.     Tenderness: There is abdominal tenderness in the left lower quadrant. There is no right CVA tenderness or left CVA tenderness.  Musculoskeletal:        General: No deformity.     Cervical back: Neck supple.  Skin:    General: Skin is warm  and dry.  Neurological:     Mental Status: She is alert. Mental status is at baseline.  Psychiatric:        Mood and Affect: Mood normal.     (all labs ordered are listed, but only abnormal results are displayed) Labs Reviewed  CBC - Abnormal; Notable for the following components:      Result Value   WBC 10.7 (*)    Platelets 436 (*)    All other components within normal limits  HCG, SERUM, QUALITATIVE  URINALYSIS, ROUTINE W REFLEX MICROSCOPIC  COMPREHENSIVE METABOLIC PANEL WITH GFR    EKG: None  Radiology: No results found.   Procedures   Medications Ordered in the ED  HYDROmorphone  (DILAUDID ) injection 1 mg (1 mg Intravenous Given 11/25/24 0602)  ondansetron  (ZOFRAN ) injection 4 mg (4 mg Intravenous Given 11/25/24 0600)  sodium chloride  0.9 % bolus 1,000 mL (1,000 mLs Intravenous New Bag/Given 11/25/24 0603)                                    Medical Decision Making 24 y/o female with L flank pain.   HTN, dry heaving at time of my arrival to the bedside. Rolling around in the bed, screaming. LLQ TTP, no CVAT on exam.   The differential diagnosis of emergent flank pain includes, but is not limited to: Nephrolithiasis/ Renal Colic, Pyelonephritis, Abdominal aortic aneurysm, Aortic dissection, Renal artery embolism, Renal vein thrombosis, Renal infarction, Renal hemorrhage, Mesenteric ischemia, Bladder tumor, Cystitis, Biliary colic, Pancreatitis, Perforated peptic ulcer,  Appendicitis, Inguinal Hernia, Diverticulitis, Bowel obstruction. Shingles, Lower lobe pneumonia, Retroperitoneal hematoma/abscess/tumor, Epidural abscess, Epidural hematoma.  Particularly in females it is important to consider (Ectopic Pregnancy,PID/TOA,Ovarian cyst, Ovarian torsion, STD)     Amount and/or Complexity of Data Reviewed Labs: ordered. Radiology: ordered.  Risk Prescription drug management.   Care of this patient signed out to oncoming ED provider DOROTHA Dec, PA-C at time of shift  change. All pertinent HPI, physical exam, and laboratory findings were discussed with them prior to my departure. Disposition of patient pending completion of workup, reevaluation, and clinical judgement of oncoming ED provider.   This chart was dictated using voice recognition software, Dragon. Despite the best efforts of this provider to proofread and correct errors, errors may still occur which can change documentation meaning.       Final diagnoses:  Left flank pain    ED Discharge Orders     None          Bobette Pleasant JONELLE DEVONNA 11/25/24 [id number]    Griselda Norris, MD 11/26/24 0510  "
